# Patient Record
Sex: Female | Born: 1965 | Race: White | Hispanic: No | Marital: Married | Smoking: Never smoker
Health system: Southern US, Community
[De-identification: ages and names within clinical notes are randomized; demographics above are authoritative.]

## PROBLEM LIST (undated history)

## (undated) DIAGNOSIS — R079 Chest pain, unspecified: Secondary | ICD-10-CM

## (undated) DIAGNOSIS — F419 Anxiety disorder, unspecified: Secondary | ICD-10-CM

## (undated) DIAGNOSIS — K862 Cyst of pancreas: Secondary | ICD-10-CM

## (undated) HISTORY — DX: Cyst of pancreas: K86.2

## (undated) HISTORY — DX: Anxiety disorder, unspecified: F41.9

## (undated) HISTORY — DX: Chest pain, unspecified: R07.9

## (undated) HISTORY — PX: ABDOMINAL SURGERY: SHX537

## (undated) HISTORY — PX: OTHER SURGICAL HISTORY: SHX169

---

## 2001-12-08 HISTORY — PX: LAPAROSCOPIC HYSTERECTOMY: SHX1926

## 2006-11-27 ENCOUNTER — Inpatient Hospital Stay (HOSPITAL_COMMUNITY): Admission: RE | Admit: 2006-11-27 | Discharge: 2006-12-02 | Payer: Self-pay | Admitting: Thoracic Surgery

## 2006-12-10 ENCOUNTER — Encounter: Admission: RE | Admit: 2006-12-10 | Discharge: 2006-12-10 | Payer: Self-pay | Admitting: Thoracic Surgery

## 2006-12-30 ENCOUNTER — Encounter: Admission: RE | Admit: 2006-12-30 | Discharge: 2006-12-30 | Payer: Self-pay | Admitting: Thoracic Surgery

## 2007-01-27 ENCOUNTER — Encounter: Admission: RE | Admit: 2007-01-27 | Discharge: 2007-01-27 | Payer: Self-pay | Admitting: Thoracic Surgery

## 2007-01-27 ENCOUNTER — Ambulatory Visit: Payer: Self-pay | Admitting: Thoracic Surgery

## 2007-03-30 ENCOUNTER — Ambulatory Visit: Payer: Self-pay | Admitting: Thoracic Surgery

## 2007-03-30 ENCOUNTER — Encounter: Admission: RE | Admit: 2007-03-30 | Discharge: 2007-03-30 | Payer: Self-pay | Admitting: Thoracic Surgery

## 2008-07-06 ENCOUNTER — Ambulatory Visit (HOSPITAL_COMMUNITY): Payer: Self-pay | Admitting: Psychiatry

## 2008-07-12 ENCOUNTER — Ambulatory Visit (HOSPITAL_COMMUNITY): Payer: Self-pay | Admitting: Psychiatry

## 2008-08-01 ENCOUNTER — Ambulatory Visit (HOSPITAL_COMMUNITY): Payer: Self-pay | Admitting: Psychiatry

## 2008-08-08 ENCOUNTER — Ambulatory Visit (HOSPITAL_COMMUNITY): Payer: Self-pay | Admitting: Psychiatry

## 2008-08-21 ENCOUNTER — Ambulatory Visit (HOSPITAL_COMMUNITY): Payer: Self-pay | Admitting: Psychiatry

## 2008-08-22 ENCOUNTER — Ambulatory Visit (HOSPITAL_COMMUNITY): Payer: Self-pay | Admitting: Psychiatry

## 2008-09-13 ENCOUNTER — Ambulatory Visit (HOSPITAL_COMMUNITY): Payer: Self-pay | Admitting: Psychiatry

## 2008-09-20 ENCOUNTER — Ambulatory Visit (HOSPITAL_COMMUNITY): Payer: Self-pay | Admitting: Psychiatry

## 2008-10-10 ENCOUNTER — Ambulatory Visit (HOSPITAL_COMMUNITY): Payer: Self-pay | Admitting: Psychiatry

## 2008-10-16 ENCOUNTER — Ambulatory Visit (HOSPITAL_COMMUNITY): Payer: Self-pay | Admitting: Psychiatry

## 2008-10-30 ENCOUNTER — Ambulatory Visit (HOSPITAL_COMMUNITY): Payer: Self-pay | Admitting: Psychiatry

## 2008-10-31 ENCOUNTER — Ambulatory Visit (HOSPITAL_COMMUNITY): Payer: Self-pay | Admitting: Psychiatry

## 2010-12-29 ENCOUNTER — Encounter: Payer: Self-pay | Admitting: Thoracic Surgery
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
## 2013-12-08 HISTORY — PX: ROTATOR CUFF REPAIR: SHX139

## 2015-12-09 HISTORY — PX: PANCREAS BIOPSY: SHX1018

## 2016-07-01 ENCOUNTER — Encounter: Payer: Self-pay | Admitting: Cardiology

## 2016-07-18 ENCOUNTER — Ambulatory Visit: Payer: Self-pay | Admitting: Cardiology

## 2016-07-30 ENCOUNTER — Encounter: Payer: Self-pay | Admitting: *Deleted

## 2016-07-30 ENCOUNTER — Other Ambulatory Visit: Payer: Self-pay | Admitting: *Deleted

## 2016-07-30 ENCOUNTER — Ambulatory Visit (INDEPENDENT_AMBULATORY_CARE_PROVIDER_SITE_OTHER): Payer: 59 | Admitting: Cardiology

## 2016-07-30 ENCOUNTER — Encounter: Payer: Self-pay | Admitting: Cardiology

## 2016-07-30 VITALS — BP 136/86 | HR 82 | Ht 66.5 in | Wt 230.0 lb

## 2016-07-30 DIAGNOSIS — R002 Palpitations: Secondary | ICD-10-CM | POA: Diagnosis not present

## 2016-07-30 DIAGNOSIS — R0789 Other chest pain: Secondary | ICD-10-CM | POA: Diagnosis not present

## 2016-07-30 MED ORDER — METOPROLOL SUCCINATE ER 50 MG PO TB24: 50.0000 mg | ORAL_TABLET | Freq: Every day | ORAL | 3 refills | Status: DC

## 2016-07-30 NOTE — Patient Instructions (Signed)
Your physician recommends that you schedule a follow-up appointment in: [place] DR. BRANCH  Your physician has recommended you make the following change in your medication:   INCREASE TOPROL XL 50 MG DAILY  Thank you for choosing [facility]!!

## 2016-07-30 NOTE — Progress Notes (Signed)
Clinical Summary Gloria Gibson is a 50 y.o.female seen as a new patient. Previously seen by Dr Lavenia Atlas cardiology. She is referred by Dr Nadara Mustard.    1. Chest pain - seen in [facility] 07/2016 with chest pain.  - 06/2016 Lexiscan [place]head: small focus reversibility inferoapical, LVEF 44%, mild inferior hypokinesis. Intermediate risk  - sharp pain/heaviness midchest. Can have shooting "lightening bolts" to left side. 9/10 in severity. Can occur at rest. Can be somewhat positional. +SOB. Heaviness can be constant for several hours. No relation to food or eating. Some occasional N/V, decreased appetite, 30 lbs loss since February.  CAD risk factors: father 45s CAD, mother with heart troubles   2. Pancreatic mass - awaiting evaluation at [facility].    3. Anxiety - notes mention significant anxiety since the death of her son.   4. Palpitations - long history, increased in February. Can occur at rest or with exertion.+diaphoresis, +SOB. Can last hours at a time - occurs several times a week. Can come on with stress or at rest, recent increased stress with loss of her child. - - better with xanax prn. Started on Toprol a few days ago, no change in symptoms - could not wear prevoius monitor more than 1 week, pad would just not stay on.  - occasional caffeine, occasional ETOH     Past Medical History:  Diagnosis Date  . Anxiety   . Chest pain   . Pancreatic cyst      Allergies  Allergen Reactions  . Codeine     Other reaction(s): GI Upset (intolerance)  . Morphine Itching    Other reaction(s): Vomiting (intolerance)  . Penicillins     Other reaction(s): Vomiting (intolerance)     Current Outpatient Prescriptions  Medication Sig Dispense Refill  . ALPRAZolam (XANAX) 1 MG tablet Take 1 tablet by mouth 3 (three) times daily.    . celecoxib (CELEBREX) 200 MG capsule Take 1 capsule by mouth daily.    . cyclobenzaprine (FLEXERIL) 10 MG tablet Take 1 tablet by mouth 2  (two) times daily as needed.    . metoprolol succinate (TOPROL-XL) 25 MG 24 hr tablet Take 1 tablet by mouth daily.    . sertraline (ZOLOFT) 100 MG tablet Take 1 tablet by mouth daily.    . SUMAtriptan (IMITREX) 50 MG tablet Take 1 tablet by mouth as directed.    . zolpidem (AMBIEN) 10 MG tablet Take 1 tablet by mouth daily.     No current facility-administered medications for this visit.      Past Surgical History:  Procedure Laterality Date  . ABDOMINAL SURGERY    . CESAREAN SECTION    . LAPAROSCOPIC HYSTERECTOMY  2003   ovaries     Allergies  Allergen Reactions  . Codeine     Other reaction(s): GI Upset (intolerance)  . Morphine Itching    Other reaction(s): Vomiting (intolerance)  . Penicillins     Other reaction(s): Vomiting (intolerance)      No family history on file.   Social History Ms. Vallone reports that she has never smoked. She has never used smokeless tobacco. Ms. Golik has no alcohol history on file.   Review of Systems CONSTITUTIONAL: No weight loss, fever, chills, weakness or fatigue.  HEENT: Eyes: No visual loss, blurred vision, double vision or yellow sclerae.No hearing loss, sneezing, congestion, runny nose or sore throat.  SKIN: No rash or itching.  CARDIOVASCULAR: per HPI RESPIRATORY: No shortness of breath, cough or sputum.  GASTROINTESTINAL: No anorexia, nausea, vomiting or diarrhea. No abdominal pain or blood.  GENITOURINARY: No burning on urination, no polyuria NEUROLOGICAL: No headache, dizziness, syncope, paralysis, ataxia, numbness or tingling in the extremities. No change in bowel or bladder control.  MUSCULOSKELETAL: No muscle, back pain, joint pain or stiffness.  LYMPHATICS: No enlarged nodes. No history of splenectomy.  PSYCHIATRIC: No history of depression or anxiety.  ENDOCRINOLOGIC: No reports of sweating, cold or heat intolerance. No polyuria or polydipsia.  Marland Kitchen   Physical Examination Vitals:   07/30/16 1438  BP: 136/86    Pulse: 82   Vitals:   07/30/16 1438  Weight: 230 lb (104.3 kg)  Height: 5' 6.5" (1.689 m)    Gen: resting comfortably, no acute distress HEENT: no scleral icterus, pupils equal round and reactive, no palptable cervical adenopathy,  CV: RRR, no m/r/g, no jvd Resp: Clear to auscultation bilaterally GI: abdomen is soft, non-tender, non-distended, normal bowel sounds, no hepatosplenomegaly MSK: extremities are warm, no edema.  Skin: warm, no rash Neuro:  no focal deficits Psych: appropriate affect     Assessment and Plan  1. Chest pain - fairly atypical symptoms, her nuclear stress did suggest possible mild inferior ischemia - we will increase Toprol XL to 50mg  as additional antianginal, pending symptoms to medical therapy consider cath at f/u  2. Palpitations - request recent heart monitor results - increase Toprol  XL to 50mg  daily to help symptoms.   F/u 4 weeks.       Arnoldo Lenis, M.D.

## 2016-08-01 ENCOUNTER — Encounter: Payer: Self-pay | Admitting: Cardiology

## 2016-09-08 ENCOUNTER — Encounter: Payer: Self-pay | Admitting: *Deleted

## 2016-09-08 ENCOUNTER — Ambulatory Visit (INDEPENDENT_AMBULATORY_CARE_PROVIDER_SITE_OTHER): Payer: 59 | Admitting: Cardiology

## 2016-09-08 ENCOUNTER — Encounter: Payer: Self-pay | Admitting: Cardiology

## 2016-09-08 VITALS — BP 141/89 | HR 91 | Ht 69.0 in | Wt 293.0 lb

## 2016-09-08 DIAGNOSIS — R0789 Other chest pain: Secondary | ICD-10-CM | POA: Diagnosis not present

## 2016-09-08 DIAGNOSIS — R002 Palpitations: Secondary | ICD-10-CM | POA: Diagnosis not present

## 2016-09-08 MED ORDER — METOPROLOL SUCCINATE ER 100 MG PO TB24: 100.0000 mg | ORAL_TABLET | Freq: Two times a day (BID) | ORAL | 3 refills | Status: DC
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
## 2016-09-08 NOTE — Patient Instructions (Signed)
Your physician recommends that you schedule a follow-up appointment in: [place] has recommended you make the following change in your medication:   INCREASE TOPROL XL 100 MG DAILY  START ASPIRIN 81 MG DAILY  Thank you for choosing [facility]!!

## 2016-11-06 ENCOUNTER — Ambulatory Visit: Payer: 59 | Admitting: Cardiology

## 2016-12-17 DIAGNOSIS — R001 Bradycardia, unspecified: Secondary | ICD-10-CM | POA: Diagnosis not present

## 2016-12-17 DIAGNOSIS — Z01818 Encounter for other preprocedural examination: Secondary | ICD-10-CM | POA: Diagnosis not present

## 2016-12-17 DIAGNOSIS — I440 Atrioventricular block, first degree: Secondary | ICD-10-CM | POA: Diagnosis not present

## 2016-12-17 DIAGNOSIS — J986 Disorders of diaphragm: Secondary | ICD-10-CM | POA: Diagnosis not present

## 2016-12-17 DIAGNOSIS — I517 Cardiomegaly: Secondary | ICD-10-CM | POA: Diagnosis not present

## 2016-12-23 ENCOUNTER — Encounter: Payer: Self-pay | Admitting: *Deleted

## 2016-12-23 ENCOUNTER — Telehealth: Payer: Self-pay | Admitting: Cardiology

## 2016-12-23 NOTE — Telephone Encounter (Signed)
Patient was told by [facility] that we can see EKG fro them.  Please verify and let patient know if she needs to request to send or not.

## 2016-12-23 NOTE — Telephone Encounter (Signed)
We can read report of EKG but see the actual EKG. I would prefer to see the actual EKG. Can they fax it over. Has patient had any new symptoms: chest pain, SOB?  Carlyle Dolly MD

## 2016-12-23 NOTE — Telephone Encounter (Signed)
Pt says Dr. Claiborne Billings Gynec[facility] oncology want to remove cyst and ovary in mid February and Dr Claiborne Billings requesting cardiac clearance (some notes are in [place]) next appt with Dr. Harl Bowie in March - LM for Dr. Evette Georges nurse to verify - pt says her EKG done on 12/17/16 was abnormal - routed to provider

## 2016-12-23 NOTE — Telephone Encounter (Signed)
Requested fax of EKG - pt did c/o chest pain "somtimes" denies SOB - mostly pain in the abdomen

## 2016-12-25 NOTE — Telephone Encounter (Signed)
Did they ever send the EKG?  Zandra Abts MD

## 2016-12-25 NOTE — Telephone Encounter (Signed)
Req'd EKG from Dr. Claiborne Billings on 1/16 - office closed will check fax when return to office.

## 2016-12-30 DIAGNOSIS — K862 Cyst of pancreas: Secondary | ICD-10-CM | POA: Diagnosis not present

## 2017-01-08 ENCOUNTER — Encounter: Payer: Self-pay | Admitting: *Deleted

## 2017-01-08 ENCOUNTER — Encounter: Payer: Self-pay | Admitting: Adult Health

## 2017-01-08 ENCOUNTER — Ambulatory Visit (INDEPENDENT_AMBULATORY_CARE_PROVIDER_SITE_OTHER): Payer: 59 | Admitting: Adult Health

## 2017-01-08 VITALS — BP 110/64 | HR 71 | Ht 66.0 in | Wt 307.0 lb

## 2017-01-08 DIAGNOSIS — Z01818 Encounter for other preprocedural examination: Secondary | ICD-10-CM | POA: Diagnosis not present

## 2017-01-08 DIAGNOSIS — R002 Palpitations: Secondary | ICD-10-CM

## 2017-01-08 NOTE — Progress Notes (Signed)
Cardiology Office Note   Date:  01/08/2017   ID:  Gloria Gibson, DOB Apr 26, 1966, MRN [id number]  PCP:  Gloria Percy, MD  Cardiologist: Gloria Spring, NP   Chief Complaint  Patient presents with  . Pre-op Exam      History of Present Illness: Gloria Gibson is a 51 y.o. female who presents for ongoing assessment and management of recurrent chest pain, with most recent [place] on 7/ 2017 at [facility] revealing a small focus reversibility inferior apical. Mild inferior hypokinesis with an LVEF of 44%. Echocardiogram was completed by no one to on 7 2017 with LVEF of 60-65%. Other history includes pancreatic mass, anxiety, and palpitations. She was last seen by Dr. Harl Gibson on 09/08/2016, at which time metoprolol was increased to 50 mg twice a day for antianginal benefit.  The patient was planned for a removal of cyst from ovary in February 2018. She will need a cardiac clearance. Apparently in Dr. Evette Gibson note an EKG was completed and was found to be abnormal.  She continues to have "lightning" discomfort on the left side. She attributes this to anxiety and stress. The patient denies any dyspnea on exertion, severe chest pain, diaphoresis, dizziness, or fatigue. She continues to have abdominal discomfort more on the right than on the left. She is awaiting cardiac clearance before having surgical remover of ovarian cyst.  Past Medical History:  Diagnosis Date  . Anxiety   . Chest pain   . Pancreatic cyst     Past Surgical History:  Procedure Laterality Date  . ABDOMINAL SURGERY    . CESAREAN SECTION    . diaphramatic injury  11/226  . LAPAROSCOPIC HYSTERECTOMY  2003   ovaries  . PANCREAS BIOPSY  2017  . ROTATOR CUFF REPAIR  2015   right     Current Outpatient Prescriptions  Medication Sig Dispense Refill  . ALPRAZolam (XANAX) 1 MG tablet Take 1 tablet by mouth 3 (three) times daily as needed.     Marland Kitchen aspirin EC 81 MG tablet Take 81 mg by mouth  daily.    . celecoxib (CELEBREX) 200 MG capsule Take 1 capsule by mouth daily as needed.     . metoprolol succinate (TOPROL-XL) 100 MG 24 hr tablet Take 100 mg by mouth 2 (two) times daily.     Marland Kitchen zolpidem (AMBIEN) 10 MG tablet Take 1 tablet by mouth daily.     No current facility-administered medications for this visit.     Allergies:   Codeine; Morphine; and Penicillins    Social History:  The patient  reports that she has never smoked. She has never used smokeless tobacco. She reports that she does not drink alcohol or use drugs.   Family History:  The patient's family history includes Brain cancer in her father; COPD in her mother; Diabetes in her brother and mother; Heart attack in her father; Heart disease in her father and mother; Heart failure in her maternal grandmother.    ROS: All other systems are reviewed and negative. Unless otherwise mentioned in H&P    PHYSICAL EXAM: VS:  BP 110/64   Pulse 71   Ht 5\' 6"  (1.676 m)   Wt (!) 307 lb (139.3 kg)   SpO2 95%   BMI 49.55 kg/m  , BMI Body mass index is 49.55 kg/m. GEN: Well nourished, well developed, in no acute distress Morbidly obese. HEENT: normal  Neck: no JVD, carotid bruits, or masses Cardiac: RRR; no murmurs, rubs, or  gallops,no edema  Respiratory: Clear to auscultation bilaterally, normal work of breathing. Diminished on the right base GI: soft, tender, nondistended, + BS MS: no deformity or atrophy  Skin: warm and dry, no rash Neuro:  Strength and sensation are intact Psych: euthymic mood, full affect   EKG: The ekg ordered today demonstrates Normal sinus rhythm with prominent R wave in V1. Otherwise normal. No evidence of ischemia. Heart rate 65 bpm.   Recent Labs: No results found for requested labs within last 8760 hours.    Lipid Panel No results found for: CHOL, TRIG, HDL, CHOLHDL, VLDL, LDLCALC, LDLDIRECT    Wt Readings from Last 3 Encounters:  01/08/17 (!) 307 lb (139.3 kg)  09/08/16 293 lb  (132.9 kg)  07/30/16 230 lb (104.3 kg)    ASSESSMENT AND PLAN:  1. Chest pain: Noncardiac in etiology. She states that this is been ongoing since July 2017 usually associated with stress and anxiety. I have reviewed her stress test. I do not believe a cardiac catheterization is indicated at this time. EKG is not indicative of ischemia are need to follow-up with any ischemic testing at this time. The patient of acceptable risk from a cardiac standpoint to proceed with surgical removal of ovarian cysts. A letter has been written and sent to Dr. Fredrich Gibson obstetrics and gynecology at [facility]. Also a copy of the letter is given to the patient.   2. Palpitations: She has not further complaints of palpitations. Continue BB peri-operatively.   3. Ovarian cysts: To be followed by Gloria Gibson.    Current medicines are reviewed at length with the patient today.    Labs/ tests ordered today include:   Orders Placed This Encounter  Procedures  . EKG 12-Lead     Disposition:   FU with 6 months unless symptomatic  Signed, Jory Sims, NP  01/08/2017 3:11 PM    [facility] [address] [place], [place] [address] Phone: [phone]; Fax: [phone]

## 2017-01-08 NOTE — Progress Notes (Signed)
Name: Gloria Gibson    DOB: [birth year]  Age: 51 y.o.  MR#: [id number]       PCP:  Rory Percy, MD      Insurance: Payor: Onnie Boer / Plan: [employer] OTHER / Product Type: *No Product type* /   CC:   No chief complaint on file.   VS Vitals:   01/08/17 1438  BP: 110/64  Pulse: 71  SpO2: 95%  Weight: (!) 307 lb (139.3 kg)  Height: 5\' 6"  (1.676 m)    Weights Current Weight  01/08/17 (!) 307 lb (139.3 kg)  09/08/16 293 lb (132.9 kg)  07/30/16 230 lb (104.3 kg)    Blood Pressure  BP Readings from Last 3 Encounters:  01/08/17 110/64  09/08/16 (!) 141/89  07/30/16 136/86     Admit date:  (Not on file) Last encounter with RMR:  Visit date not found   Allergy Codeine; Morphine; and Penicillins  Current Outpatient Prescriptions  Medication Sig Dispense Refill  . ALPRAZolam (XANAX) 1 MG tablet Take 1 tablet by mouth 3 (three) times daily as needed.     Marland Kitchen aspirin EC 81 MG tablet Take 81 mg by mouth daily.    . celecoxib (CELEBREX) 200 MG capsule Take 1 capsule by mouth daily as needed.     . metoprolol succinate (TOPROL-XL) 100 MG 24 hr tablet Take 100 mg by mouth 2 (two) times daily.     Marland Kitchen zolpidem (AMBIEN) 10 MG tablet Take 1 tablet by mouth daily.     No current facility-administered medications for this visit.     Discontinued Meds:    Medications Discontinued During This Encounter  Medication Reason  . metoprolol succinate (TOPROL-XL) 100 MG 24 hr tablet Error  . ondansetron (ZOFRAN-ODT) 4 MG disintegrating tablet Error  . sertraline (ZOLOFT) 100 MG tablet Error  . SUMAtriptan (IMITREX) 50 MG tablet Error    There are no active problems to display for this patient.   LABS No results found for: NA, K, CL, CO2, GLUCOSE, BUN, CREATININE, CALCIUM, GFRNONAA, GFRAA CMP  No results found for: NA, K, CL, CO2, GLUCOSE, BUN, CREATININE, CALCIUM, PROT, ALBUMIN, AST, ALT, ALKPHOS, BILITOT, GFRNONAA, GFRAA  No results found for: WBC, HGB, HCT, MCV  Lipid  Panel  No results found for: CHOL, TRIG, HDL, CHOLHDL, VLDL, LDLCALC, LDLDIRECT  ABG No results found for: PHART, PCO2ART, PO2ART, HCO3, TCO2, ACIDBASEDEF, O2SAT   No results found for: TSH BNP (last 3 results) No results for input(s): BNP in the last 8760 hours.  ProBNP (last 3 results) No results for input(s): PROBNP in the last 8760 hours.  Cardiac Panel (last 3 results) No results for input(s): CKTOTAL, CKMB, TROPONINI, RELINDX in the last 72 hours.  Iron/TIBC/Ferritin/ %Sat No results found for: IRON, TIBC, FERRITIN, IRONPCTSAT   EKG Orders placed or performed in visit on 08/05/16  . EKG     Prior Assessment and Plan Problem List as of 01/08/2017 Reviewed: 09/09/2016  9:44 AM by Carlyle Dolly, MD   None       Imaging: No results found.

## 2017-01-08 NOTE — Patient Instructions (Signed)
Your physician wants you to follow-up in: 6 Months with Dr. Branch. You will receive a reminder letter in the mail two months in advance. If you don't receive a letter, please call our office to schedule the follow-up appointment.  Your physician recommends that you continue on your current medications as directed. Please refer to the Current Medication list given to you today.  If you need a refill on your cardiac medications before your next appointment, please call your pharmacy.  Thank you for choosing [facility]!   

## 2017-02-06 ENCOUNTER — Ambulatory Visit: Payer: 59 | Admitting: Cardiology

## 2017-02-23 DIAGNOSIS — R1900 Intra-abdominal and pelvic swelling, mass and lump, unspecified site: Secondary | ICD-10-CM | POA: Diagnosis not present

## 2017-03-23 DIAGNOSIS — R1900 Intra-abdominal and pelvic swelling, mass and lump, unspecified site: Secondary | ICD-10-CM | POA: Diagnosis not present

## 2017-03-26 DIAGNOSIS — N135 Crossing vessel and stricture of ureter without hydronephrosis: Secondary | ICD-10-CM | POA: Diagnosis not present

## 2017-03-26 DIAGNOSIS — I10 Essential (primary) hypertension: Secondary | ICD-10-CM | POA: Diagnosis not present

## 2017-03-26 DIAGNOSIS — I2510 Atherosclerotic heart disease of native coronary artery without angina pectoris: Secondary | ICD-10-CM | POA: Diagnosis not present

## 2017-05-25 DIAGNOSIS — D519 Vitamin B12 deficiency anemia, unspecified: Secondary | ICD-10-CM | POA: Diagnosis not present

## 2017-05-25 DIAGNOSIS — R5382 Chronic fatigue, unspecified: Secondary | ICD-10-CM | POA: Diagnosis not present

## 2017-06-12 DIAGNOSIS — M25551 Pain in right hip: Secondary | ICD-10-CM | POA: Diagnosis not present

## 2017-10-08 ENCOUNTER — Other Ambulatory Visit: Payer: Self-pay | Admitting: Cardiology

## 2017-10-10 DIAGNOSIS — K760 Fatty (change of) liver, not elsewhere classified: Secondary | ICD-10-CM | POA: Diagnosis not present

## 2017-10-10 DIAGNOSIS — K869 Disease of pancreas, unspecified: Secondary | ICD-10-CM | POA: Diagnosis not present

## 2017-10-12 DIAGNOSIS — K869 Disease of pancreas, unspecified: Secondary | ICD-10-CM | POA: Diagnosis not present

## 2017-11-03 DIAGNOSIS — G518 Other disorders of facial nerve: Secondary | ICD-10-CM | POA: Diagnosis not present

## 2017-11-03 DIAGNOSIS — M792 Neuralgia and neuritis, unspecified: Secondary | ICD-10-CM | POA: Diagnosis not present

## 2017-11-05 DIAGNOSIS — R930 Abnormal findings on diagnostic imaging of skull and head, not elsewhere classified: Secondary | ICD-10-CM | POA: Diagnosis not present

## 2017-11-05 DIAGNOSIS — M792 Neuralgia and neuritis, unspecified: Secondary | ICD-10-CM | POA: Diagnosis not present

## 2017-11-05 DIAGNOSIS — R29818 Other symptoms and signs involving the nervous system: Secondary | ICD-10-CM | POA: Diagnosis not present

## 2017-11-06 DIAGNOSIS — R5382 Chronic fatigue, unspecified: Secondary | ICD-10-CM | POA: Diagnosis not present

## 2017-11-06 DIAGNOSIS — E559 Vitamin D deficiency, unspecified: Secondary | ICD-10-CM | POA: Diagnosis not present

## 2017-11-06 DIAGNOSIS — M797 Fibromyalgia: Secondary | ICD-10-CM | POA: Diagnosis not present

## 2017-11-12 DIAGNOSIS — M792 Neuralgia and neuritis, unspecified: Secondary | ICD-10-CM | POA: Diagnosis not present

## 2017-11-12 DIAGNOSIS — M797 Fibromyalgia: Secondary | ICD-10-CM | POA: Diagnosis not present

## 2018-05-11 DIAGNOSIS — M797 Fibromyalgia: Secondary | ICD-10-CM | POA: Diagnosis not present

## 2018-05-11 DIAGNOSIS — R000 Tachycardia, unspecified: Secondary | ICD-10-CM | POA: Diagnosis not present

## 2018-05-11 DIAGNOSIS — E559 Vitamin D deficiency, unspecified: Secondary | ICD-10-CM | POA: Diagnosis not present

## 2018-05-13 DIAGNOSIS — K862 Cyst of pancreas: Secondary | ICD-10-CM | POA: Diagnosis not present

## 2018-05-13 DIAGNOSIS — R5382 Chronic fatigue, unspecified: Secondary | ICD-10-CM | POA: Diagnosis not present

## 2018-05-18 DIAGNOSIS — R234 Changes in skin texture: Secondary | ICD-10-CM | POA: Diagnosis not present

## 2018-11-10 ENCOUNTER — Other Ambulatory Visit: Payer: Self-pay | Admitting: Cardiology

## 2018-11-19 DIAGNOSIS — E559 Vitamin D deficiency, unspecified: Secondary | ICD-10-CM | POA: Diagnosis not present

## 2018-11-19 DIAGNOSIS — R000 Tachycardia, unspecified: Secondary | ICD-10-CM | POA: Diagnosis not present

## 2018-11-19 DIAGNOSIS — Z13220 Encounter for screening for lipoid disorders: Secondary | ICD-10-CM | POA: Diagnosis not present

## 2018-11-19 DIAGNOSIS — M797 Fibromyalgia: Secondary | ICD-10-CM | POA: Diagnosis not present

## 2018-11-23 DIAGNOSIS — K862 Cyst of pancreas: Secondary | ICD-10-CM | POA: Diagnosis not present

## 2018-11-23 DIAGNOSIS — D271 Benign neoplasm of left ovary: Secondary | ICD-10-CM | POA: Diagnosis not present

## 2020-03-21 IMAGING — MG MA Digital Diagnostic Mammo
8 series · 8 of 24 positions shown · non-contrast
Comparison: none

INDICATIONS FOR EXAMINATION:                                                              
 Patient History:                                                                          
 Menarche at age 14. First Full-Term Pregnancy at age 20. Other cancer, age 52. Patient    
 used Hormonal                                                                             
 Contraceptives for 5 years. 05/10/2019, Benign Excisional Biopsy on the left              
 side.                                                                                     
 Maternal aunt had breast cancer, age 40. Mother had colorectal cancer, age 71.            
 Last screening mammogram was performed 11 month(s) ago.
REASON FOR EXAM: Clinical finding.                                                                         
 Indicated Problems:                                                                       
 Patient has a history of left breast squamous cell cancer in Thursday May, 2019.

[R MLO synth-2D]
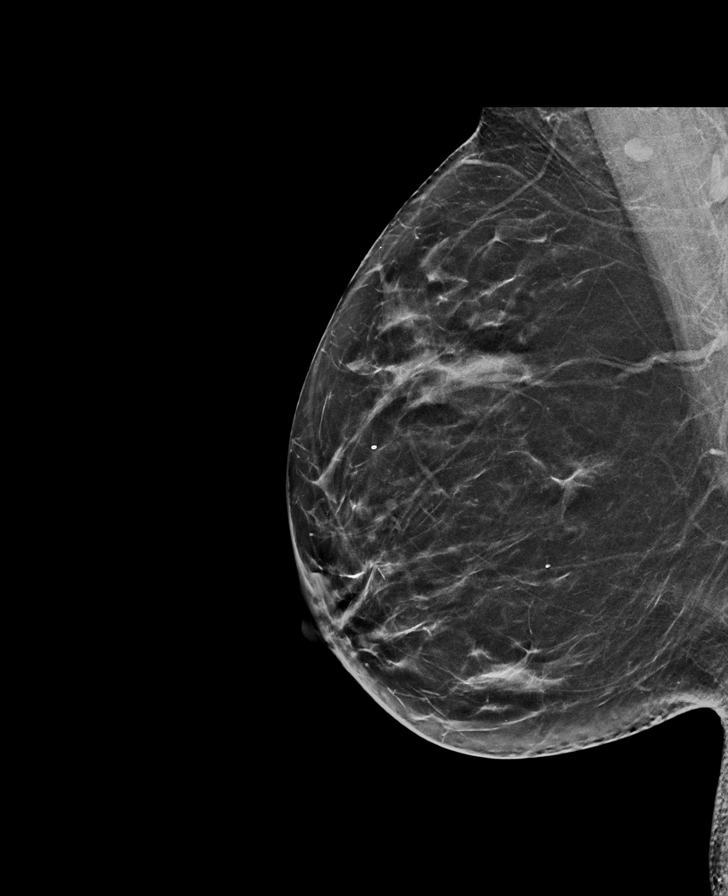

[R CC synth-2D]
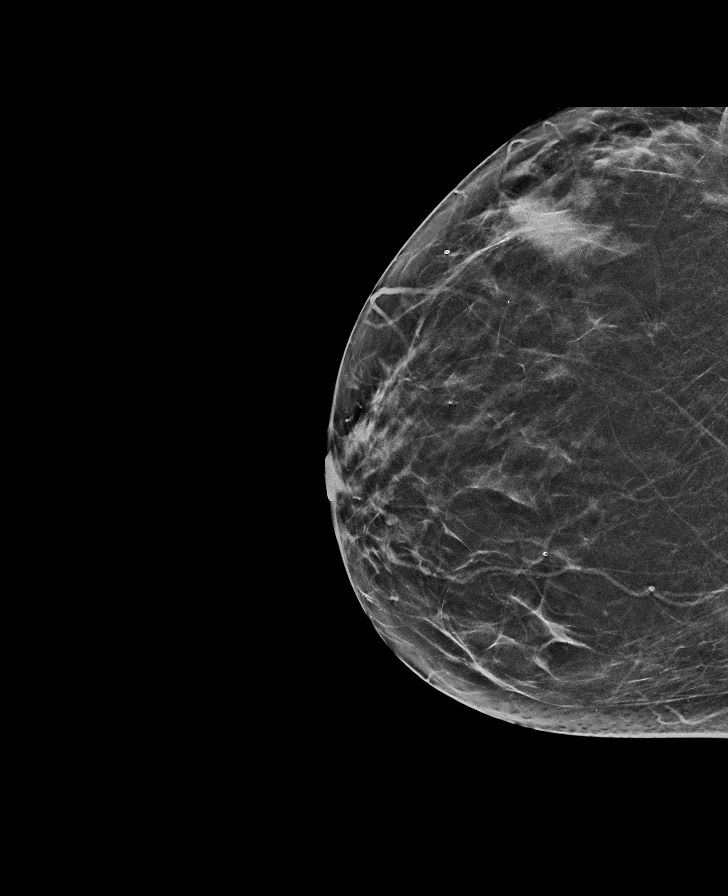

[L MLO synth-2D]
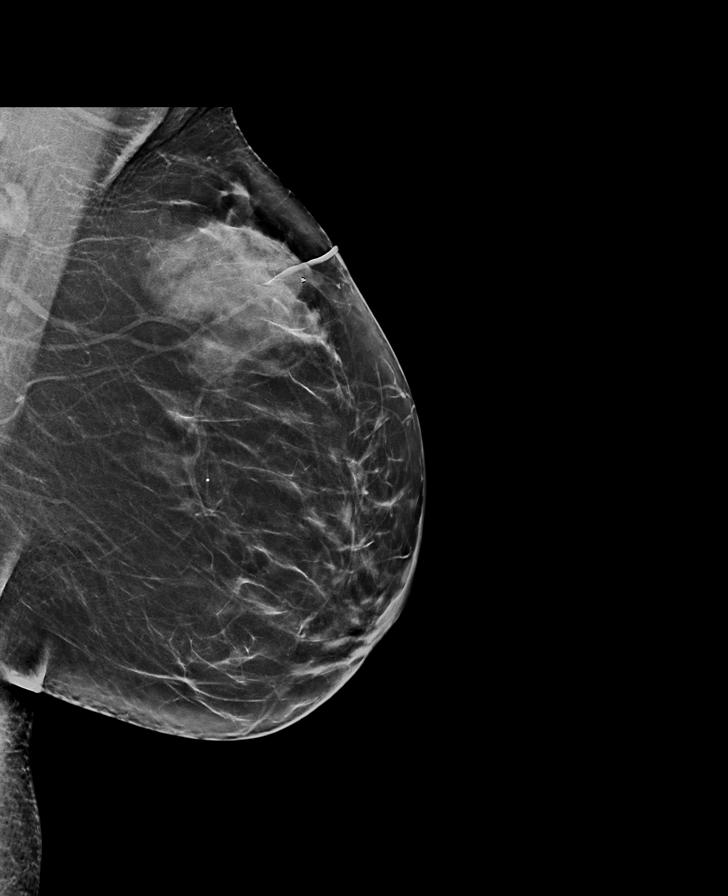

[L CC synth-2D]
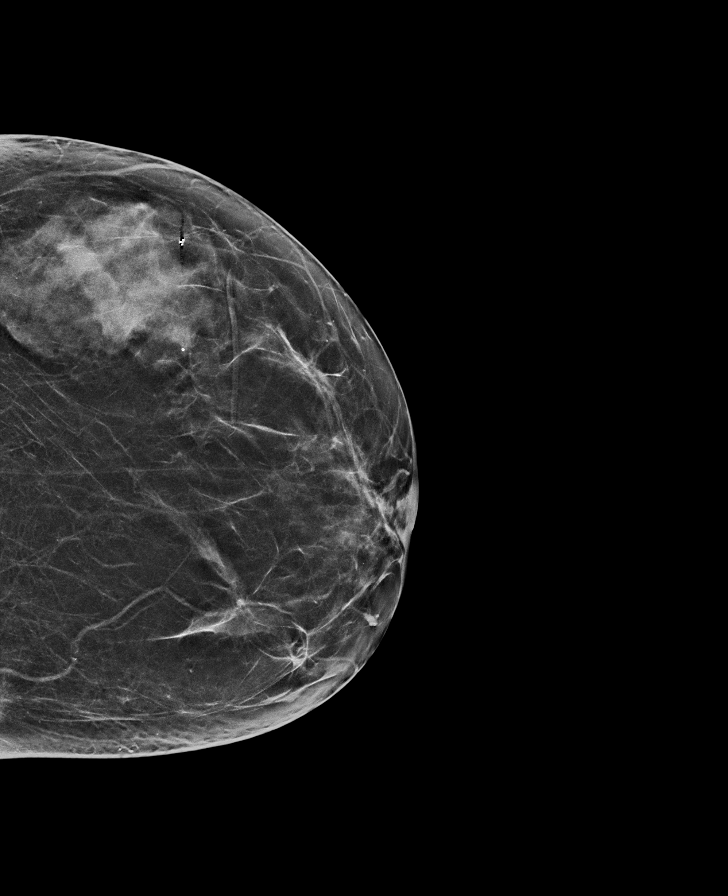

[R CC tomo · tomo slice 29/56.0]
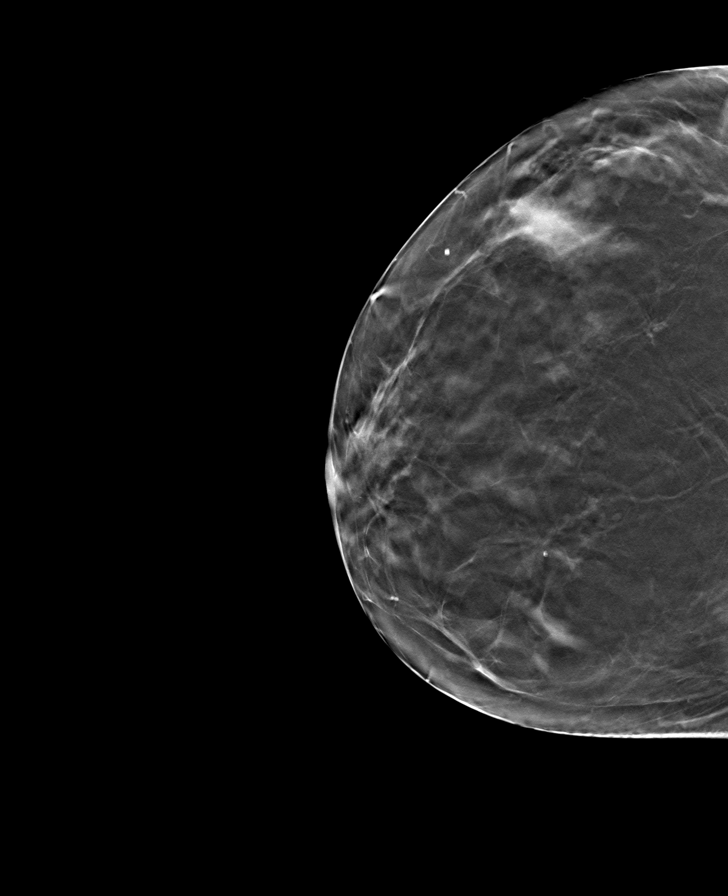

[L CC tomo · tomo slice 32/63.0]
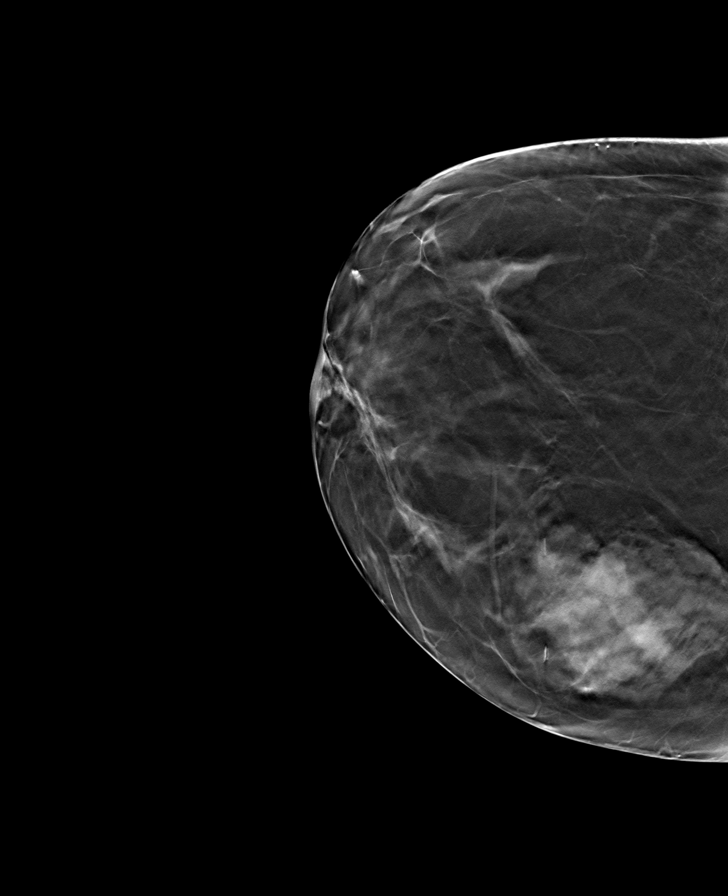

[L MLO tomo · tomo slice 34/67.0]
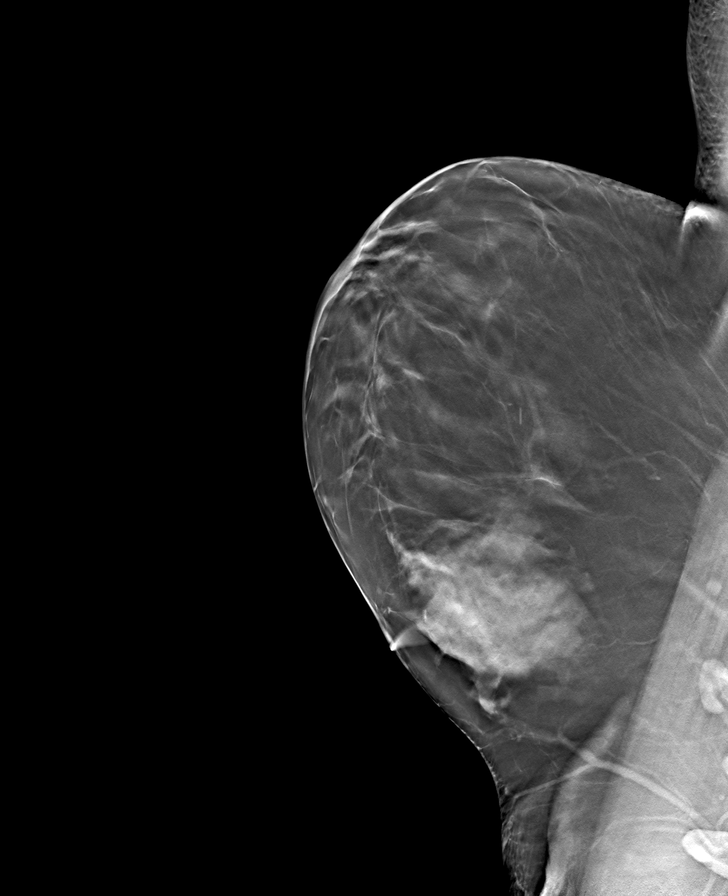

[R MLO tomo · tomo slice 33/64.0]
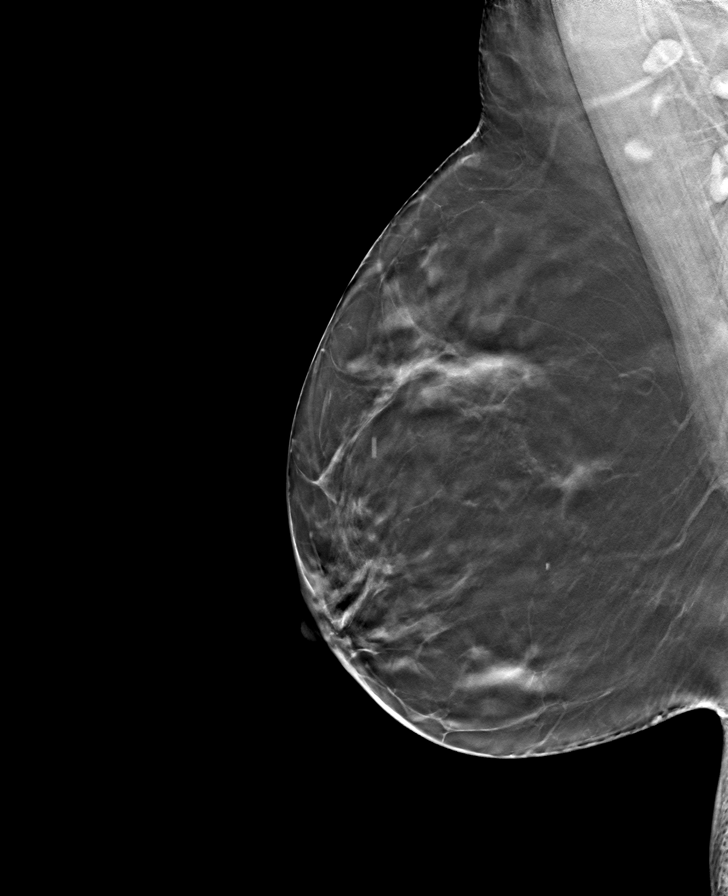

[8 of 24 positions shown; findings below may reference images not displayed]

Risk values:                                                                              
 MRS Risk Manager: No High Risk calculations found at this time.                           
 NCI Lifetime model risk: 5.2%.                                                            
 Tyrer-Cuzick 10 year model risk: 3.1%.                                                    
 Tyrer-Cuzick Lifetime model risk: 10.0%.                                                  

 Physical Findings:                                                                        
 Scar marker (s) placed on breast (s).                                                     

 DIGITAL IMAGE VIEWS:                                                                      
 Bilateral CC views were taken.                                                            
 Bilateral MLO views were taken.                                                           
 Bilateral 3D Tomo views were taken.                                                       

 PRIOR STUDY COMPARISONS                                                                   
 12/07/2017 Bilateral MA Digital Screening Mammo, [facility]. 01/22/2018 Bilateral Digital         
 Bilateral Diag                                                                            
 Mammogram, [facility]. 04/14/2019 Bilateral Digital Bilateral Diag Mammogram, [facility].                 

 TISSUE DENSITY:                                                                           
 The breasts are heterogeneously dense, which may obscure small masses.
FINDINGS: Analyzed By CAD.                                                                          
 No significant changes when compared with prior studies.                                  
 No suspicious findings are seen.                                                          

 OVERALL ACR BI-RADS ASSESSMENT: BI-RADS CATEGORY 1: NEGATIVE                              

 RECOMMENDATION:                                                                           
 Screening Mammogram of both breasts in 1 year.

## 2020-10-17 ENCOUNTER — Other Ambulatory Visit: Payer: Self-pay | Admitting: Physician Assistant

## 2020-10-17 DIAGNOSIS — Z1231 Encounter for screening mammogram for malignant neoplasm of breast: Secondary | ICD-10-CM

## 2020-12-13 ENCOUNTER — Ambulatory Visit: Payer: 59
